# Patient Record
Sex: Female | Born: 1960 | Race: White | Hispanic: No | Marital: Single
Health system: Southern US, Community
[De-identification: ages and names within clinical notes are randomized; demographics above are authoritative.]

## PROBLEM LIST (undated history)

## (undated) DIAGNOSIS — K900 Celiac disease: Secondary | ICD-10-CM

---

## 2004-02-24 DIAGNOSIS — K900 Celiac disease: Secondary | ICD-10-CM

## 2004-02-24 HISTORY — DX: Celiac disease: K90.0

## 2017-12-06 ENCOUNTER — Emergency Department (HOSPITAL_COMMUNITY): Payer: No Typology Code available for payment source

## 2017-12-06 ENCOUNTER — Encounter (HOSPITAL_COMMUNITY): Payer: Self-pay | Admitting: Emergency Medicine

## 2017-12-06 ENCOUNTER — Emergency Department (HOSPITAL_COMMUNITY)
Admission: EM | Admit: 2017-12-06 | Discharge: 2017-12-06 | Disposition: A | Discharge status: Home / Self Care | Payer: No Typology Code available for payment source | Attending: Emergency Medicine | Admitting: Emergency Medicine

## 2017-12-06 DIAGNOSIS — Z79899 Other long term (current) drug therapy: Secondary | ICD-10-CM | POA: Insufficient documentation

## 2017-12-06 DIAGNOSIS — M542 Cervicalgia: Secondary | ICD-10-CM | POA: Insufficient documentation

## 2017-12-06 HISTORY — DX: Celiac disease: K90.0

## 2017-12-06 MED ORDER — METHOCARBAMOL 500 MG PO TABS: 500.0000 mg | ORAL_TABLET | Freq: Two times a day (BID) | ORAL | 0 refills | Status: AC

## 2017-12-06 MED ORDER — ACETAMINOPHEN 325 MG PO TABS
650.0000 mg | ORAL_TABLET | Freq: Once | ORAL | Status: AC
  Administered 2017-12-06: 650 mg via ORAL
  Filled 2017-12-06: qty 2

## 2017-12-06 MED ORDER — METHOCARBAMOL 500 MG PO TABS
500.0000 mg | ORAL_TABLET | Freq: Once | ORAL | Status: AC
  Administered 2017-12-06: 500 mg via ORAL
  Filled 2017-12-06: qty 1
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
